# Patient Record
Sex: Male | Born: 2000 | Race: White | Hispanic: No | Marital: Single | Smoking: Never smoker
Health system: Southern US, Community
[De-identification: ages and names within clinical notes are randomized; demographics above are authoritative.]

## PROBLEM LIST (undated history)

## (undated) DIAGNOSIS — F909 Attention-deficit hyperactivity disorder, unspecified type: Secondary | ICD-10-CM

## (undated) HISTORY — PX: MYRINGOTOMY: SUR874

---
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
## 2022-05-26 ENCOUNTER — Ambulatory Visit (INDEPENDENT_AMBULATORY_CARE_PROVIDER_SITE_OTHER): Payer: BC Managed Care – PPO

## 2022-05-26 ENCOUNTER — Ambulatory Visit
Admission: RE | Admit: 2022-05-26 | Discharge: 2022-05-26 | Disposition: A | Discharge status: Home / Self Care | Payer: BC Managed Care – PPO | Source: Ambulatory Visit | Attending: Family Medicine | Admitting: Family Medicine

## 2022-05-26 VITALS — BP 123/87 | HR 92 | Temp 98.8°F | Resp 18 | Ht 75.0 in | Wt 330.0 lb

## 2022-05-26 DIAGNOSIS — R059 Cough, unspecified: Secondary | ICD-10-CM

## 2022-05-26 MED ORDER — BENZONATATE 200 MG PO CAPS: 200.0000 mg | ORAL_CAPSULE | Freq: Three times a day (TID) | ORAL | 0 refills | Status: AC | PRN

## 2022-05-26 MED ORDER — DOXYCYCLINE HYCLATE 100 MG PO CAPS: 100.0000 mg | ORAL_CAPSULE | Freq: Two times a day (BID) | ORAL | 0 refills | Status: AC

## 2022-05-26 MED ORDER — PREDNISONE 20 MG PO TABS: ORAL_TABLET | ORAL | 0 refills | Status: DC

## 2022-05-26 NOTE — Discharge Instructions (Addendum)
Advised of chest x-ray results with hardcopy provided to patient.  Instructed patient to take medication as directed with food to completion.  Advised patient to take prednisone with first dose of doxycycline for the next 5 of 7 days.  Advised may use Tessalon daily or as needed for cough.  Encourage patient to increase daily water intake to 64 ounces per day while taking these medications.  Advised if symptoms worsen and/or unresolved please follow-up with PCP or here for further evaluation.

## 2022-05-26 NOTE — ED Provider Notes (Signed)
[facility]    CSN: [id number] Arrival date & time: 05/26/22  1351      History   Chief Complaint Chief Complaint  Patient presents with   Cough    headache, sore throat, cough, congestioncovid and flu tests are negative but was treated for strep a couple of weeks ago - Entered by patient    HPI Allen Barrett is a 21 y.o. male.   HPI 21 year old male presents with headache, sore throat, congestion, and cough for 2 days. PMH significant for morbid obesity and ADHD.  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 Authorizing Provider  benzonatate (TESSALON) 200 MG capsule Take 1 capsule (200 mg total) by mouth 3 (three) times daily as needed for up to 7 days. 05/26/22 06/02/22 Yes Eliezer Lofts, FNP  doxycycline (VIBRAMYCIN) 100 MG capsule Take 1 capsule (100 mg total) by mouth 2 (two) times daily for 7 days. 05/26/22 06/02/22 Yes Eliezer Lofts, FNP  predniSONE (DELTASONE) 20 MG tablet Take 3 tabs PO daily x 5 days. 05/26/22  Yes Eliezer Lofts, FNP  cetirizine (ZYRTEC) 10 MG tablet Take 1 tablet (10 mg total) by mouth daily. 05/02/20   Scot Jun, FNP  ibuprofen (ADVIL) 600 MG tablet Take 1 tablet (600 mg total) by mouth every 6 (six) hours as needed. 05/02/20   Scot Jun, FNP  triamcinolone (KENALOG) 0.1 % Apply 1 application topically 2 (two) times daily as needed. 05/02/20   Scot Jun, FNP    Family History Family History  Problem Relation Age of Onset   Multiple sclerosis Mother    Hyperlipidemia Father    Hypertension Father     Social History Social History   Tobacco Use    Smoking status: Never   Smokeless tobacco: Never  Vaping Use   Vaping Use: Never used  Substance Use Topics   Alcohol use: No   Drug use: No     Allergies   Patient has no known allergies.   Review of Systems Review of Systems  HENT:  Positive for congestion and sore throat.   All other systems reviewed and are negative.    Physical Exam Triage Vital Signs ED Triage Vitals  Enc Vitals Group     BP      Pulse      Resp      Temp      Temp src      SpO2      Weight      Height      Head Circumference      Peak Flow      Pain Score      Pain Loc      Pain Edu?      Excl. in [place]?    No data found.  Updated Vital Signs BP 123/87 (BP Location: Right Arm)   Pulse 92   Temp 98.8 F (37.1 C) (Oral)   Resp 18   Ht 6\' 3"  (1.905 m)   Wt (!) 330 lb (149.7 kg)   SpO2 94%   BMI 41.25 kg/m  Physical Exam Vitals and nursing note reviewed.  Constitutional:      General: He is not in acute distress.    Appearance: Normal appearance. He is obese. He is not ill-appearing.  HENT:     Head: Normocephalic and atraumatic.     Right Ear: Tympanic membrane, ear canal and external ear normal.     Left Ear: Tympanic membrane, ear canal and external ear normal.     Mouth/Throat:     Mouth: Mucous membranes are moist.     Pharynx: Oropharynx is clear.  Eyes:     Extraocular Movements: Extraocular movements intact.     Conjunctiva/sclera: Conjunctivae normal.     Pupils: Pupils are equal, round, and reactive to light.  Cardiovascular:     Rate and Rhythm: Normal rate and regular rhythm.     Pulses: Normal pulses.     Heart sounds: Normal heart sounds.  Pulmonary:     Effort: Pulmonary effort is normal.     Breath sounds: No wheezing, rhonchi or rales.     Comments: Diminished breath sounds noted throughout Musculoskeletal:        General: Normal range of motion.     Cervical back: Normal range of motion and neck supple. No tenderness.  Lymphadenopathy:      Cervical: No cervical adenopathy.  Skin:    General: Skin is warm and dry.  Neurological:     General: No focal deficit present.     Mental Status: He is alert and oriented to person, place, and time.      UC Treatments / Results  Labs (all labs ordered are listed, but only abnormal results are displayed) Labs Reviewed - No data to display  EKG   Radiology DG Chest 2 View  Result Date: 05/26/2022 CLINICAL DATA:  Cough. EXAM: CHEST - 2 VIEW COMPARISON:  December 17, 2015. FINDINGS: The heart size and mediastinal contours are within normal limits. Both lungs are clear. The visualized skeletal structures are unremarkable. IMPRESSION: No active cardiopulmonary disease. Electronically Signed   By: Lupita Raider M.D.   On: 05/26/2022 16:09    Procedures Procedures (including critical care time)  Medications Ordered in UC Medications - No data to display  Initial Impression / Assessment and Plan / UC Course  I have reviewed the triage vital signs and the nursing notes.  Pertinent labs & imaging results that were available during my care of the patient were reviewed by me and considered in my medical decision making (see chart for details).     MDM: 1.  Cough-CXR revealed above, Rx'd doxycycline, prednisone, Tessalon. Advised of chest x-ray results with hardcopy provided to patient.  Instructed patient to take medication as directed with food to completion.  Advised patient to take prednisone with first dose of doxycycline for the next 5 of 7 days.  Advised may use Tessalon daily or as needed for cough.  Encourage patient to increase daily water intake to 64 ounces per day while taking these medications.  Advised if symptoms worsen and/or unresolved please follow-up with PCP or here for further evaluation.  Work note provided to patient prior to discharge per request.  Patient discharged home, hemodynamically stable. Final Clinical Impressions(s) / UC Diagnoses   Final diagnoses:   Cough, unspecified type     Discharge Instructions      Advised of chest x-ray results with hardcopy provided to patient.  Instructed patient to take medication as directed with food to completion.  Advised patient to  take prednisone with first dose of doxycycline for the next 5 of 7 days.  Advised may use Tessalon daily or as needed for cough.  Encourage patient to increase daily water intake to 64 ounces per day while taking these medications.  Advised if symptoms worsen and/or unresolved please follow-up with PCP or here for further evaluation.     ED Prescriptions     Medication Sig Dispense Auth. Provider   doxycycline (VIBRAMYCIN) 100 MG capsule Take 1 capsule (100 mg total) by mouth 2 (two) times daily for 7 days. 14 capsule Eliezer Lofts, FNP   predniSONE (DELTASONE) 20 MG tablet Take 3 tabs PO daily x 5 days. 15 tablet Eliezer Lofts, FNP   benzonatate (TESSALON) 200 MG capsule Take 1 capsule (200 mg total) by mouth 3 (three) times daily as needed for up to 7 days. 40 capsule Eliezer Lofts, FNP      PDMP not reviewed this encounter.   Eliezer Lofts, [place] 05/26/22 [phone]

## 2022-05-26 NOTE — ED Triage Notes (Signed)
Patient c/o cough and congestion x 2 days.  Patient was seen on Monday had FLU, COVID and STREP test done, all negative.  Patient feels worse and requesting 2nd opinion.  Patient has been taken Mucinex.

## 2022-07-27 ENCOUNTER — Ambulatory Visit: Payer: Managed Care, Other (non HMO) | Admitting: Internal Medicine

## 2022-08-10 ENCOUNTER — Ambulatory Visit: Payer: Managed Care, Other (non HMO) | Admitting: Internal Medicine

## 2022-08-24 ENCOUNTER — Ambulatory Visit (INDEPENDENT_AMBULATORY_CARE_PROVIDER_SITE_OTHER): Payer: BC Managed Care – PPO | Admitting: Internal Medicine

## 2022-08-24 ENCOUNTER — Encounter: Payer: Self-pay | Admitting: Internal Medicine

## 2022-08-24 ENCOUNTER — Telehealth: Payer: Self-pay

## 2022-08-24 VITALS — BP 124/68 | HR 88 | Temp 98.1°F | Resp 20 | Ht 75.0 in | Wt 348.0 lb

## 2022-08-24 DIAGNOSIS — L501 Idiopathic urticaria: Secondary | ICD-10-CM

## 2022-08-24 NOTE — Telephone Encounter (Signed)
I called Windell Hummingbird PA at [facility] wait time to speak to a representative is [phone] (fourteen ) minutes I entered our clinic number for a return call. We need a copy of patients last allergy test results. Patient referred to [place] and states he was tested a few weeks ago.

## 2022-08-24 NOTE — Progress Notes (Signed)
New Patient Note  RE: Allen Barrett MRN: [id number] DOB: 2001/03/11 Date of Office Visit: 08/24/2022  Consult requested by: Windell Hummingbird, PA-C Primary care provider: Pcp, No  Chief Complaint: Urticaria (Started 53-20 years old )  History of Present Illness: I had the pleasure of seeing Allen Barrett for initial evaluation at the [facility] on 08/24/2022. Allen Barrett is a 22 y.o. male, who is referred here by Pcp, No for the evaluation of urticaria.  History obtained from patient, chart review .  Urticaria:  Symptoms started  2 years ago.  Initially symptoms occurred every other day.  No clear triggers.  Would resolve after 30 minutes.  Initially treated with topical creams without good response.   Slowly decreased in frequency and now occurring twice a week.    Allen Barrett was seen by [facility] and they did performed SPT and sIgE to food and environmentals.  Records pending.    Due to testing Allen Barrett removed cabbage, carrotts, sesame seeds from diet.  Did not fully remove tree nuts.  This did not fully alleviate hives.   Denies any associated food, medication or contact exposure.  Denies any association with sun, cold, heat, exercise, pressure, water or vibration exposure.  Lesions are not painful or fixed.  Denies any associated fevers or arthritis.  Pictures consistent with urticaria   Assessment and Plan: Allen Barrett is a 22 y.o. male with: Idiopathic urticaria - Plan: CBC With Diff/Platelet, CMP14+EGFR, Tryptase, TSH, Thyroid antibodies, Chronic Urticaria, C-reactive protein, Alpha-Gal Panel   Plan: Patient Instructions  Chronic Idiopathic Urticaria: - this is defined as hives lasting more than 6 weeks without an identifiable trigger - hives can be from a number of different sources including infections, allergies, vibration, temperature, pressure among many others other possible causes - often an identifiable cause is not determined - some potential triggers include: stress,  illness, NSAIDs, aspirin, hormonal changes - you do not have any red flag symptoms to make [place] concerned about secondary causes of hives, but we will screen for these for reassurance with: CBC w diff, CMP, tryptase, TSH, hive panel, alpha-gal panel, inflammatory markers - approximately 50% of patients with chronic hives can have some associated swelling of the face/lips/eyelids (this is not a cause for alarm and does not typically progress onto systemic allergic reactions)  Therapy Plan:  - start zyrtec (cetirizine) 10mg  once twice daily  - if hives are uncontrolled, increase zyrtec (cetirizine) to 20mg  twice daily -this is maximum dose - can increase or decrease dosing depending on symptom control to a maximum dose of 4 tablets of antihistamine daily. Wait until hives free for at least one month prior to decreasing dose.   - if hives are still uncontrolled with the above regimen, please arrange an appointment for discussion of Xolair (omalizumab)- an injectable medication for hives  Can use one of the following in place of zyrtec if desires: Claritin (loratadine) 10 mg, Xyzal (levocetirizine) 5 mg or Allegra (fexofenadine) 180 mg daily as needed  Can purchase generic antihistamines much more cheaply on [place] or at places like [employer] or [place]  -Based on history I believe your previous positive food testing is due to a sensitization and not a true food allergy -Once hives are under control for at least 7 days introduce foods you are avoiding 1 at a time at home, separate food introduction by 72 hours  Follow up: 4 weeks   Thank you so much for letting  me partake in your care today.  Don't hesitate to reach out if you have any additional concerns!  Roney Marion, MD  [facility] [place] [place]      No orders of the defined types were placed in this encounter.  Lab Orders         CBC With Diff/Platelet         CMP14+EGFR         Tryptase          TSH         Thyroid antibodies         Chronic Urticaria         C-reactive protein         Alpha-Gal Panel      Other allergy screening: Asthma: no Rhino conjunctivitis: no Food allergy:  as above  Medication allergy: no Hymenoptera allergy: no Urticaria: yes Eczema:no History of recurrent infections suggestive of immunodeficency: no  Diagnostics: Skin Testing:  deferred due to recent testing  .  Results interpreted by myself and discussed with patient/family.   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 30 tablet 0   penicillin v potassium (VEETID) 500 MG tablet Take by mouth.     No current facility-administered medications for this visit.   Allergies: No Known Allergies Social History: Social History   Socioeconomic History   Marital status: Single    Spouse name: Not on file   Number of children: Not on file   Years of education: Not on file   Highest education level: Not on file  Occupational History   Not on file  Tobacco Use   Smoking status: Never    Passive exposure: Past (mother has in the past)   Smokeless tobacco: Never  Vaping Use   Vaping Use: Never used  Substance and Sexual Activity   Alcohol use: No   Drug use: No   Sexual activity: Not on file  Other Topics Concern   Not on file  Social History Narrative   Not on file   Social Determinants of Health   Financial Resource  Strain: Not on file  Food Insecurity: Not on file  Transportation Needs: Not on file  Physical Activity: Not on file  Stress: Not on file  Social Connections: Not on file   Lives in a single-family home, no roaches in the house and bed is 2 feet off the floor.  Dust mite precautions on bed and pillows.  Not exposed to fumes, chemicals or dust.  HEPA filter in the home and home is not near an interstate industrial area. Smoking: No exposure Occupation: Works as a Electronics engineer History: Environmental education officer in the house: no Charity fundraiser in the family room: no Carpet in the bedroom: yes Heating: electric Cooling: central Pet: no  Family History: Family History  Problem Relation Age of Onset   Multiple sclerosis Mother  Hyperlipidemia Father    Hypertension Father    Asthma Neg Hx    Allergic rhinitis Neg Hx    Atopy Neg Hx    Eczema Neg Hx    Immunodeficiency Neg Hx    Angioedema Neg Hx      ROS: All others negative except as noted per HPI.   Objective: BP 124/68   Pulse 88   Temp 98.1 F (36.7 C) (Temporal)   Resp 20   Ht 6\' 3"  (1.905 m)   Wt (!) 348 lb (157.9 kg)   SpO2 97%   BMI 43.50 kg/m  Body mass index is 43.5 kg/m.  General Appearance:  Alert, cooperative, no distress, appears stated age  Head:  Normocephalic, without obvious abnormality, atraumatic  Eyes:  Conjunctiva clear, EOM's intact  Nose: Nares normal, normal mucosa, no visible anterior polyps, and septum midline  Throat: Lips, tongue normal; teeth and gums normal, normal posterior oropharynx and no tonsillar exudate  Neck: Supple, symmetrical  Lungs:   clear to auscultation bilaterally, Respirations unlabored, no coughing  Heart:  regular rate and rhythm and no murmur, Appears well perfused  Extremities: No edema  Skin: Skin color, texture, turgor normal, no rashes or lesions on visualized portions of skin  Neurologic: No gross deficits   The plan was reviewed with the  patient/family, and all questions/concerned were addressed.  It was my pleasure to see Allen Barrett today and participate in his care. Please feel free to contact me with any questions or concerns.  Sincerely,  Roney Marion, MD Allergy & Immunology  [facility] office: [phone] [facility] office: [phone]

## 2022-08-24 NOTE — Patient Instructions (Addendum)
Chronic Idiopathic Urticaria: - this is defined as hives lasting more than 6 weeks without an identifiable trigger - hives can be from a number of different sources including infections, allergies, vibration, temperature, pressure among many others other possible causes - often an identifiable cause is not determined - some potential triggers include: stress, illness, NSAIDs, aspirin, hormonal changes - you do not have any red flag symptoms to make [place] concerned about secondary causes of hives, but we will screen for these for reassurance with: CBC w diff, CMP, tryptase, TSH, hive panel, alpha-gal panel, inflammatory markers - approximately 50% of patients with chronic hives can have some associated swelling of the face/lips/eyelids (this is not a cause for alarm and does not typically progress onto systemic allergic reactions)  Therapy Plan:  - start zyrtec (cetirizine) 10mg  once twice daily  - if hives are uncontrolled, increase zyrtec (cetirizine) to 20mg  twice daily -this is maximum dose - can increase or decrease dosing depending on symptom control to a maximum dose of 4 tablets of antihistamine daily. Wait until hives free for at least one month prior to decreasing dose.   - if hives are still uncontrolled with the above regimen, please arrange an appointment for discussion of Xolair (omalizumab)- an injectable medication for hives  Can use one of the following in place of zyrtec if desires: Claritin (loratadine) 10 mg, Xyzal (levocetirizine) 5 mg or Allegra (fexofenadine) 180 mg daily as needed  Can purchase generic antihistamines much more cheaply on [place] or at places like [employer] or [place]  -Based on history I believe your previous positive food testing is due to a sensitization and not a true food allergy -Once hives are under control for at least 7 days introduce foods you are avoiding 1 at a time at home, separate food introduction by 72 hours  Follow  up: 4 weeks   Thank you so much for letting me partake in your care today.  Don't hesitate to reach out if you have any additional concerns!  Roney Marion, MD  [facility] [place] [place]

## 2022-08-25 LAB — CMP14+EGFR
Albumin: 4.8 g/dL (ref 4.3–5.2)
BUN: 11 mg/dL (ref 6–20)
Potassium: 4.6 mmol/L (ref 3.5–5.2)

## 2022-08-25 LAB — TSH: TSH: 3.3 u[IU]/mL (ref 0.450–4.500)

## 2022-08-25 LAB — TRYPTASE

## 2022-08-25 LAB — C-REACTIVE PROTEIN: CRP: 3 mg/L (ref 0–10)

## 2022-08-25 LAB — CBC WITH DIFF/PLATELET
Basophils Absolute: 0.1 10*3/uL (ref 0.0–0.2)
Eos: 1 %
RDW: 12.7 % (ref 11.6–15.4)

## 2022-08-25 LAB — CHRONIC URTICARIA

## 2022-08-25 NOTE — Telephone Encounter (Signed)
Thanks

## 2022-08-26 LAB — ALPHA-GAL PANEL

## 2022-08-26 LAB — CBC WITH DIFF/PLATELET
EOS (ABSOLUTE): 0.1 10*3/uL (ref 0.0–0.4)
Hematocrit: 45.7 % (ref 37.5–51.0)
Immature Grans (Abs): 0 10*3/uL (ref 0.0–0.1)
Lymphocytes Absolute: 4.5 10*3/uL — ABNORMAL HIGH (ref 0.7–3.1)
MCV: 85 fL (ref 79–97)
Monocytes Absolute: 0.8 10*3/uL (ref 0.1–0.9)
Monocytes: 7 %
Neutrophils Absolute: 5.6 10*3/uL (ref 1.4–7.0)
Neutrophils: 50 %
Platelets: 357 10*3/uL (ref 150–450)
WBC: 11.1 10*3/uL — ABNORMAL HIGH (ref 3.4–10.8)

## 2022-08-26 LAB — CMP14+EGFR
Alkaline Phosphatase: 115 IU/L (ref 51–125)
CO2: 23 mmol/L (ref 20–29)
Calcium: 10.5 mg/dL — ABNORMAL HIGH (ref 8.7–10.2)
Globulin, Total: 3 g/dL (ref 1.5–4.5)
Glucose: 82 mg/dL (ref 70–99)
Sodium: 139 mmol/L (ref 134–144)
eGFR: 113 mL/min/{1.73_m2} (ref >59)

## 2022-08-26 LAB — THYROID ANTIBODIES: Thyroglobulin Antibody: <1 IU/mL (ref 0.0–0.9)

## 2022-08-29 LAB — CMP14+EGFR: AST: 44 IU/L — ABNORMAL HIGH (ref 0–40)

## 2022-08-29 LAB — CBC WITH DIFF/PLATELET
Hemoglobin: 15.1 g/dL (ref 13.0–17.7)
MCH: 28 pg (ref 26.6–33.0)
RBC: 5.39 x10E6/uL (ref 4.14–5.80)

## 2022-08-29 LAB — ALPHA-GAL PANEL
Allergen Lamb IgE: <0.1 kU/L
O215-IgE Alpha-Gal: <0.1 kU/L

## 2022-08-29 LAB — THYROID ANTIBODIES: Thyroperoxidase Ab SerPl-aCnc: <9 IU/mL (ref 0–26)

## 2022-08-30 NOTE — Telephone Encounter (Signed)
Spoke to [place], she tried to track down the MA for provider to pull chart and fax records of skin testing and any labs, but phone only rang in clinic. Allen Barrett  took down message with my name , phone number and fax. Hopefully records can be faxed today, in a day or two.

## 2022-08-31 LAB — CBC WITH DIFF/PLATELET
Basos: 1 %
Immature Granulocytes: 0 %
Lymphs: 41 %
MCHC: 33 g/dL (ref 31.5–35.7)

## 2022-08-31 LAB — CMP14+EGFR
ALT: 92 IU/L — ABNORMAL HIGH (ref 0–44)
Albumin/Globulin Ratio: 1.6 (ref 1.2–2.2)
BUN/Creatinine Ratio: 11 (ref 9–20)
Bilirubin Total: 0.4 mg/dL (ref 0.0–1.2)
Chloride: 99 mmol/L (ref 96–106)
Creatinine, Ser: 0.99 mg/dL (ref 0.76–1.27)
Total Protein: 7.8 g/dL (ref 6.0–8.5)

## 2022-09-07 NOTE — Progress Notes (Signed)
Blood work showed elevation of some of his liver enzymes.  I recommend following up with primary care.  Otherwise the rest of his labs (thyroid function, mast cell evaluation, kidney function, blood counts) were normal.  Can someone let patient know?

## 2022-09-09 NOTE — Telephone Encounter (Signed)
See message on labs.  

## 2022-09-22 ENCOUNTER — Ambulatory Visit: Payer: Managed Care, Other (non HMO) | Admitting: Internal Medicine

## 2022-09-22 DIAGNOSIS — J309 Allergic rhinitis, unspecified: Secondary | ICD-10-CM

## 2022-10-13 ENCOUNTER — Ambulatory Visit
Admission: RE | Admit: 2022-10-13 | Discharge: 2022-10-13 | Disposition: A | Discharge status: Home / Self Care | Payer: BC Managed Care – PPO | Source: Ambulatory Visit | Attending: Family Medicine | Admitting: Family Medicine

## 2022-10-13 VITALS — BP 128/89 | HR 81 | Temp 98.5°F | Resp 16 | Ht 75.5 in | Wt 340.0 lb

## 2022-10-13 DIAGNOSIS — R059 Cough, unspecified: Secondary | ICD-10-CM | POA: Diagnosis not present

## 2022-10-13 MED ORDER — PREDNISONE 20 MG PO TABS: ORAL_TABLET | ORAL | 0 refills | Status: DC

## 2022-10-13 MED ORDER — AZITHROMYCIN 250 MG PO TABS: 250.0000 mg | ORAL_TABLET | Freq: Every day | ORAL | 0 refills | Status: DC

## 2022-10-13 NOTE — ED Provider Notes (Signed)
[facility]    CSN: [id number] Arrival date & time: 10/13/22  1301      History   Chief Complaint Chief Complaint  Patient presents with   Cough    Sour throat, productive cough - Entered by patient    HPI Allen Barrett is a 22 y.o. male.   HPI 22 year old male presents with cough, (nonproductive), sore throat, and fatigue for 2 days.  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 Authorizing Provider  azithromycin (ZITHROMAX) 250 MG tablet Take 1 tablet (250 mg total) by mouth daily. Take first 2 tablets together, then 1 every day until finished. 10/13/22  Yes Trevor Iha, FNP  EPINEPHrine 0.3 mg/0.3 mL IJ SOAJ injection SMARTSIG:Milliliter(s) IM 07/07/22  Yes [provider]  ibuprofen (ADVIL) 600 MG tablet Take 1 tablet (600 mg total) by mouth every 6 (six) hours as needed. 05/02/20  Yes Bing Neighbors, NP  predniSONE (DELTASONE) 20 MG tablet Take 3 tabs PO daily x 5 days. 10/13/22  Yes Trevor Iha, FNP    Family History Family History  Problem Relation Age of Onset   Multiple sclerosis Mother    Hyperlipidemia Father    Hypertension Father    Asthma Neg Hx    Allergic rhinitis Neg Hx    Atopy Neg Hx    Eczema Neg Hx    Immunodeficiency Neg Hx    Angioedema Neg Hx     Social History Social History   Tobacco Use   Smoking status: Never    Passive exposure: Past (mother has in the past)   Smokeless tobacco: Never  Vaping Use   Vaping Use: Never used  Substance Use Topics   Alcohol use: No   Drug use: No     Allergies   Patient has no known allergies.   Review of  Systems Review of Systems  HENT:  Positive for congestion.   Respiratory:  Positive for cough.   All other systems reviewed and are negative.    Physical Exam Triage Vital Signs ED Triage Vitals  Enc Vitals Group     BP      Pulse      Resp      Temp      Temp src      SpO2      Weight      Height      Head Circumference      Peak Flow      Pain Score      Pain Loc      Pain Edu?      Excl. in GC?    No data found.  Updated Vital Signs BP 128/89 (BP Location: Right Arm)   Pulse 81   Temp 98.5 F (36.9 C) (Oral)   Resp 16   Ht 6' 3.5" (1.918 m)   Wt (!) 340 lb (154.2 kg)   SpO2 96%   BMI 41.94 kg/m      Physical Exam Vitals and nursing note reviewed.  Constitutional:      Appearance: Normal appearance. He is obese.  He is ill-appearing.  HENT:     Head: Normocephalic and atraumatic.     Right Ear: Tympanic membrane, ear canal and external ear normal.     Left Ear: Tympanic membrane, ear canal and external ear normal.     Mouth/Throat:     Mouth: Mucous membranes are moist.     Pharynx: Oropharynx is clear.  Eyes:     Extraocular Movements: Extraocular movements intact.     Conjunctiva/sclera: Conjunctivae normal.     Pupils: Pupils are equal, round, and reactive to light.  Cardiovascular:     Rate and Rhythm: Normal rate and regular rhythm.     Pulses: Normal pulses.     Heart sounds: Normal heart sounds.  Pulmonary:     Effort: Pulmonary effort is normal.     Breath sounds: Normal breath sounds.     Comments: Infrequent nonproductive cough noted on exam Musculoskeletal:        General: Normal range of motion.     Cervical back: Normal range of motion and neck supple.  Skin:    General: Skin is warm and dry.  Neurological:     General: No focal deficit present.     Mental Status: He is alert and oriented to person, place, and time. Mental status is at baseline.      UC Treatments / Results  Labs (all labs ordered are listed, but only  abnormal results are displayed) Labs Reviewed - No data to display  EKG   Radiology No results found.  Procedures Procedures (including critical care time)  Medications Ordered in UC Medications - No data to display  Initial Impression / Assessment and Plan / UC Course  I have reviewed the triage vital signs and the nursing notes.  Pertinent labs & imaging results that were available during my care of the patient were reviewed by me and considered in my medical decision making (see chart for details).     MDM: 1. Cough-Rx'd Zithromax (500 mg day 1, then 250 mg daily x 4 days) prednisone 60 mg daily x 5 days. Advised patient to take medication as directed with food to completion.  Advised patient to take prednisone with Zithromax daily for the next 5 days.  Encouraged increase daily water intake to 64 ounces per day while taking these medications.  Advised if symptoms worsen and/or unresolved please follow-up with PCP or here for further evaluation.  Patient discharged home, hemodynamically stable.  Work note provided to patient prior to discharge. Final Clinical Impressions(s) / UC Diagnoses   Final diagnoses:  Cough, unspecified type     Discharge Instructions      Advised patient to take medication as directed with food to completion.  Advised patient to take prednisone with Zithromax daily for the next 5 days.  Encouraged increase daily water intake to 64 ounces per day while taking these medications.  Advised if symptoms worsen and/or unresolved please follow-up with PCP or here for further evaluation.     ED Prescriptions     Medication Sig Dispense Auth. Provider   azithromycin (ZITHROMAX) 250 MG tablet Take 1 tablet (250 mg total) by mouth daily. Take first 2 tablets together, then 1 every day until finished. 6 tablet Trevor Iha, FNP   predniSONE (DELTASONE) 20 MG tablet Take 3 tabs PO daily x 5 days. 15 tablet Trevor Iha, FNP      PDMP not reviewed this  encounter.   Trevor Iha, FNP 10/13/22 1408

## 2022-10-13 NOTE — Discharge Instructions (Addendum)
Advised patient to take medication as directed with food to completion.  Advised patient to take prednisone with Zithromax daily for the next 5 days.  Encouraged increase daily water intake to 64 ounces per day while taking these medications.  Advised if symptoms worsen and/or unresolved please follow-up with PCP or here for further evaluation.

## 2022-10-13 NOTE — ED Triage Notes (Signed)
Patient c/o non-productive cough, sore throat and fatigue x 2 days.  Patient has taken Dayquil and Nyquil.

## 2023-04-19 ENCOUNTER — Ambulatory Visit
Admission: RE | Admit: 2023-04-19 | Discharge: 2023-04-19 | Disposition: A | Discharge status: Home / Self Care | Payer: BC Managed Care – PPO | Source: Ambulatory Visit | Attending: Family Medicine | Admitting: Family Medicine

## 2023-04-19 ENCOUNTER — Ambulatory Visit: Payer: Self-pay

## 2023-04-19 VITALS — BP 137/95 | HR 97 | Temp 98.1°F | Resp 17

## 2023-04-19 DIAGNOSIS — J3489 Other specified disorders of nose and nasal sinuses: Secondary | ICD-10-CM

## 2023-04-19 DIAGNOSIS — J0100 Acute maxillary sinusitis, unspecified: Secondary | ICD-10-CM | POA: Diagnosis not present

## 2023-04-19 DIAGNOSIS — H6123 Impacted cerumen, bilateral: Secondary | ICD-10-CM | POA: Diagnosis not present

## 2023-04-19 MED ORDER — AMOXICILLIN-POT CLAVULANATE 875-125 MG PO TABS: 1.0000 | ORAL_TABLET | Freq: Two times a day (BID) | ORAL | 0 refills | Status: AC

## 2023-04-19 MED ORDER — PREDNISONE 20 MG PO TABS: ORAL_TABLET | ORAL | 0 refills | Status: DC

## 2023-04-19 NOTE — ED Triage Notes (Signed)
Pt c/o cough, congestion and bilateral ear pressure since Sat. No Known Fever. Taking dayquil and nyquil prn.

## 2023-04-19 NOTE — Discharge Instructions (Addendum)
Advised patient to take medication as directed with food to completion.  Advised patient to take prednisone with first dose of Augmentin for the next 5 of 10 days.  Encouraged to increase daily water intake to 64 ounces per day while taking these medications.  Advised if symptoms worsen and/or unresolved please follow-up with PCP or here for further evaluation.

## 2023-04-19 NOTE — ED Provider Notes (Signed)
[facility]    CSN: [id number] Arrival date & time: 04/19/23  1251      History   Chief Complaint Chief Complaint  Patient presents with   Cough   Otalgia    Pressure/bilateral   Nasal Congestion    HPI Allen Barrett is a 22 y.o. male.   HPI Pleasant 22 year old male presents with cough, congestion and bilateral ear pressure for 5 days.  PMH significant for morbid obesity and ADHD.  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 Authorizing Provider  amoxicillin-clavulanate (AUGMENTIN) 875-125 MG tablet Take 1 tablet by mouth 2 (two) times daily for 10 days. 04/19/23 04/29/23 Yes Trevor Iha, FNP  predniSONE (DELTASONE) 20 MG tablet Take 3 tabs PO daily x 5 days. 04/19/23  Yes Trevor Iha, FNP  EPINEPHrine 0.3 mg/0.3 mL IJ SOAJ injection SMARTSIG:Milliliter(s) IM 07/07/22   [provider]  ibuprofen (ADVIL) 600 MG tablet Take 1 tablet (600 mg total) by mouth every 6 (six) hours as needed. 05/02/20   Bing Neighbors, NP    Family History Family History  Problem Relation Age of Onset   Multiple sclerosis Mother    Hyperlipidemia Father    Hypertension Father    Asthma Neg Hx    Allergic rhinitis Neg Hx    Atopy Neg Hx    Eczema Neg Hx    Immunodeficiency Neg Hx    Angioedema Neg Hx     Social History Social History   Tobacco Use   Smoking status: Never    Passive exposure: Past (mother has in the past)   Smokeless tobacco: Never  Vaping Use   Vaping status: Never Used  Substance Use Topics   Alcohol use: No   Drug use: No     Allergies   Patient has no known  allergies.   Review of Systems Review of Systems   Physical Exam Triage Vital Signs ED Triage Vitals [04/19/23 1305]  Encounter Vitals Group     BP (!) 137/95     Systolic BP Percentile      Diastolic BP Percentile      Pulse Rate 97     Resp 17     Temp 98.1 F (36.7 C)     Temp Source Oral     SpO2 97 %     Weight      Height      Head Circumference      Peak Flow      Pain Score 0     Pain Loc      Pain Education      Exclude from Growth Chart    No data found.  Updated Vital Signs BP (!) 137/95 (BP Location: Left Arm)   Pulse 97   Temp 98.1 F (36.7 C) (Oral)   Resp 17   SpO2 97%   Visual Acuity Right Eye Distance:   Left Eye Distance:   Bilateral Distance:    Right Eye Near:   Left Eye Near:    Bilateral Near:     Physical Exam Vitals and nursing  note reviewed.  Constitutional:      Appearance: Normal appearance. He is normal weight.  HENT:     Head: Normocephalic and atraumatic.     Right Ear: External ear normal.     Left Ear: External ear normal.     Ears:     Comments: Excessive cerumen noted in bilateral EACs.  Post bilateral ear lavage: Right EAC-clear, Right TM-erythematous, bulging; Left EAC-clear, Left TM-clear, retracted with good light reflex and mobility    Mouth/Throat:     Mouth: Mucous membranes are moist.     Pharynx: Oropharynx is clear.  Eyes:     Extraocular Movements: Extraocular movements intact.     Conjunctiva/sclera: Conjunctivae normal.     Pupils: Pupils are equal, round, and reactive to light.  Cardiovascular:     Rate and Rhythm: Normal rate and regular rhythm.     Pulses: Normal pulses.     Heart sounds: Normal heart sounds.  Pulmonary:     Effort: Pulmonary effort is normal.     Breath sounds: Normal breath sounds. No wheezing, rhonchi or rales.  Musculoskeletal:        General: Normal range of motion.     Cervical back: Normal range of motion and neck supple.  Skin:    General: Skin is warm and dry.   Neurological:     General: No focal deficit present.     Mental Status: He is alert and oriented to person, place, and time. Mental status is at baseline.      UC Treatments / Results  Labs (all labs ordered are listed, but only abnormal results are displayed) Labs Reviewed - No data to display  EKG   Radiology No results found.  Procedures Procedures (including critical care time)  Medications Ordered in UC Medications - No data to display  Initial Impression / Assessment and Plan / UC Course  I have reviewed the triage vital signs and the nursing notes.  Pertinent labs & imaging results that were available during my care of the patient were reviewed by me and considered in my medical decision making (see chart for details).     MDM: 1.  Acute maxillary sinusitis, recurrence not specified-Rx'd Augmentin 875/125 mg tablet: Take 1 tablet twice daily x 10 days; 2.  Sinus pressure-Rx'd prednisone 20 mg tablet: Take 3 tabs p.o. daily x 5 days; 3.  Excessive cerumen in both ear canals-resolved with bilateral ear lavage. Advised patient to take medication as directed with food to completion.  Advised patient to take prednisone with first dose of Augmentin for the next 5 of 10 days.  Encouraged to increase daily water intake to 64 ounces per day while taking these medications.  Advised if symptoms worsen and/or unresolved please follow-up with PCP or here for further evaluation.  Patient discharged home, hemodynamically stable. Final Clinical Impressions(s) / UC Diagnoses   Final diagnoses:  Acute maxillary sinusitis, recurrence not specified  Sinus pressure  Excessive cerumen in both ear canals     Discharge Instructions      Advised patient to take medication as directed with food to completion.  Advised patient to take prednisone with first dose of Augmentin for the next 5 of 10 days.  Encouraged to increase daily water intake to 64 ounces per day while taking these  medications.  Advised if symptoms worsen and/or unresolved please follow-up with PCP or here for further evaluation.     ED Prescriptions     Medication Sig Dispense Auth.  Provider   amoxicillin-clavulanate (AUGMENTIN) 875-125 MG tablet Take 1 tablet by mouth 2 (two) times daily for 10 days. 20 tablet Trevor Iha, FNP   predniSONE (DELTASONE) 20 MG tablet Take 3 tabs PO daily x 5 days. 15 tablet Trevor Iha, FNP      PDMP not reviewed this encounter.   Trevor Iha, FNP 04/19/23 1400

## 2023-06-14 ENCOUNTER — Telehealth: Payer: Self-pay | Admitting: Internal Medicine

## 2023-06-14 NOTE — Telephone Encounter (Signed)
 Pt's dad request a call back from [facility] he states he spoke with her yesterday concerning an issue.

## 2023-06-14 NOTE — Telephone Encounter (Signed)
 Spoke with dad need to get the test codes for the lab test we had ordered in march and will call dad back with those codes

## 2023-06-15 NOTE — Telephone Encounter (Signed)
 I have codes printed out for dad to pick up here in [place] dad understood

## 2023-06-29 ENCOUNTER — Telehealth: Payer: Self-pay

## 2023-06-29 NOTE — Telephone Encounter (Signed)
Dad calling earlier today needing the office visit notes of 08/24/2022 in order for his insurance to cover certain labs that was done that day. Will have Sherri to fax 08/24/2022 office notes to [facility] for them to review before the insurance company makes a decision to pay for the certain labs that were drawn. A signed release form was signed by patient and emailed to [facility] asthma. I printed off the signed release form from my email and put it in for scanning. Dad will let us know if he will need anything else. [facility] fax number [phone] Attn: tiffany;[id number]

## 2023-07-15 ENCOUNTER — Other Ambulatory Visit: Payer: Self-pay

## 2023-07-15 ENCOUNTER — Ambulatory Visit
Admission: RE | Admit: 2023-07-15 | Discharge: 2023-07-15 | Disposition: A | Discharge status: Home / Self Care | Payer: BC Managed Care – PPO | Source: Ambulatory Visit | Attending: Family Medicine | Admitting: Family Medicine

## 2023-07-15 VITALS — BP 138/83 | HR 87 | Temp 98.7°F | Resp 18 | Ht 76.0 in | Wt 300.0 lb

## 2023-07-15 DIAGNOSIS — J039 Acute tonsillitis, unspecified: Secondary | ICD-10-CM | POA: Diagnosis not present

## 2023-07-15 DIAGNOSIS — K122 Cellulitis and abscess of mouth: Secondary | ICD-10-CM

## 2023-07-15 LAB — POCT INFLUENZA A/B
Influenza A, POC: NEGATIVE
Influenza B, POC: NEGATIVE

## 2023-07-15 LAB — POCT RAPID STREP A (OFFICE): Rapid Strep A Screen: NEGATIVE

## 2023-07-15 MED ORDER — PREDNISONE 20 MG PO TABS: ORAL_TABLET | ORAL | 0 refills | Status: DC

## 2023-07-15 MED ORDER — AMOXICILLIN-POT CLAVULANATE 875-125 MG PO TABS: 1.0000 | ORAL_TABLET | Freq: Two times a day (BID) | ORAL | 0 refills | Status: DC

## 2023-07-15 NOTE — Discharge Instructions (Addendum)
 Advised patient to take medication as directed with food to completion.  Advised patient to take prednisone with first dose of Augmentin for the next 5 of 7 days.  Encouraged to increase daily water intake to 64 ounces per day while taking these medications.  Advised if symptoms worsen and/or unresolved please follow-up with PCP or here for further evaluation.

## 2023-07-15 NOTE — ED Triage Notes (Signed)
 Patient c/o nasal drainage, sore throat, cough and congestion x 4 days.  Patient has taken Dayquil and Nyquil w/o any relief.

## 2023-07-15 NOTE — ED Provider Notes (Signed)
 [facility]    CSN: [id number] Arrival date & time: 07/15/23  0901      History   Chief Complaint Chief Complaint  Patient presents with   Chills    Chills, cough, congestion, sore throat and swollen glands - Entered by patient    HPI Allen Barrett is a 23 y.o. male.   HPI 23 year old male presents with nasal drainage, sore throat, cough and congestion for 4 to 5 days.  PMH significant for morbid obesity and ADHD.  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 Authorizing Provider  amoxicillin -clavulanate (AUGMENTIN ) 875-125 MG tablet Take 1 tablet by mouth every 12 (twelve) hours. 07/15/23  Yes Teddy Sharper, FNP  predniSONE  (DELTASONE ) 20 MG tablet Take 3 tabs PO daily x 5 days. 07/15/23  Yes Teddy Sharper, FNP  EPINEPHrine 0.3 mg/0.3 mL IJ SOAJ injection SMARTSIG:Milliliter(s) IM 07/07/22   [provider]  ibuprofen  (ADVIL ) 600 MG tablet Take 1 tablet (600 mg total) by mouth every 6 (six) hours as needed. 05/02/20   Arloa Suzen RAMAN, NP    Family History Family History  Problem Relation Age of Onset   Multiple sclerosis Mother    Hyperlipidemia Father    Hypertension Father    Asthma Neg Hx    Allergic rhinitis Neg Hx    Atopy Neg Hx    Eczema Neg Hx    Immunodeficiency Neg Hx    Angioedema Neg Hx     Social History Social History   Tobacco Use   Smoking status: Never    Passive exposure: Past (mother has in the past)   Smokeless tobacco: Never  Vaping Use   Vaping status: Never Used  Substance Use Topics   Alcohol use: No   Drug use: No     Allergies   Patient has no known  allergies.   Review of Systems Review of Systems  HENT:  Positive for congestion and sore throat.   Respiratory:  Positive for cough.   All other systems reviewed and are negative.    Physical Exam Triage Vital Signs ED Triage Vitals  Encounter Vitals Group     BP 07/15/23 0938 138/83     Systolic BP Percentile --      Diastolic BP Percentile --      Pulse Rate 07/15/23 0938 87     Resp 07/15/23 0938 18     Temp 07/15/23 0938 98.7 F (37.1 C)     Temp Source 07/15/23 0938 Oral     SpO2 07/15/23 0938 95 %     Weight 07/15/23 0940 300 lb (136.1 kg)     Height 07/15/23 0940 6' 4 (1.93 m)     Head Circumference --      Peak Flow --      Pain Score 07/15/23 0940 7     Pain Loc --      Pain Education --      Exclude from Growth Chart --    No data found.  Updated Vital Signs BP 138/83 (BP Location: Right Arm)   Pulse 87  Temp 98.7 F (37.1 C) (Oral)   Resp 18   Ht 6' 4 (1.93 m)   Wt 300 lb (136.1 kg)   SpO2 95%   BMI 36.52 kg/m   Physical Exam Vitals and nursing note reviewed.  Constitutional:      General: He is not in acute distress.    Appearance: Normal appearance. He is obese. He is ill-appearing.  HENT:     Head: Normocephalic and atraumatic.     Right Ear: Tympanic membrane, ear canal and external ear normal.     Left Ear: Tympanic membrane, ear canal and external ear normal.     Mouth/Throat:     Mouth: Mucous membranes are moist.     Pharynx: Oropharynx is clear. Uvula midline. Posterior oropharyngeal erythema and uvula swelling present.     Tonsils: 4+ on the right. 4+ on the left.  Eyes:     Extraocular Movements: Extraocular movements intact.     Conjunctiva/sclera: Conjunctivae normal.     Pupils: Pupils are equal, round, and reactive to light.  Cardiovascular:     Rate and Rhythm: Normal rate and regular rhythm.     Pulses: Normal pulses.     Heart sounds: Normal heart sounds.  Pulmonary:     Effort: Pulmonary effort is normal.      Breath sounds: Normal breath sounds. No wheezing, rhonchi or rales.  Musculoskeletal:        General: Normal range of motion.     Cervical back: Normal range of motion and neck supple.  Skin:    General: Skin is warm and dry.  Neurological:     General: No focal deficit present.     Mental Status: He is alert and oriented to person, place, and time.  Psychiatric:        Mood and Affect: Mood normal.        Behavior: Behavior normal.      UC Treatments / Results  Labs (all labs ordered are listed, but only abnormal results are displayed) Labs Reviewed  POCT INFLUENZA A/B - Normal  POCT RAPID STREP A (OFFICE) - Normal    EKG   Radiology No results found.  Procedures Procedures (including critical care time)  Medications Ordered in UC Medications - No data to display  Initial Impression / Assessment and Plan / UC Course  I have reviewed the triage vital signs and the nursing notes.  Pertinent labs & imaging results that were available during my care of the patient were reviewed by me and considered in my medical decision making (see chart for details).     MDM: 1.  Acute tonsillitis, unspecified etiology-Rx'd Augmentin  875/125 mg tablet: Take 1 tablet twice daily x 7 days, Rx'd prednisone  20 mg tablet: Take 3 tabs p.o. daily x 5 days; 2.  Uvulitis-Rx'd Augmentin  875/125 mg tablet: Take 1 tablet twice daily x 7 days. Advised patient to take medication as directed with food to completion.  Advised patient to take prednisone  with first dose of Augmentin  for the next 5 of 7 days.  Encouraged to increase daily water intake to 64 ounces per day while taking these medications.  Advised if symptoms worsen and/or unresolved please follow-up with PCP or here for further evaluation.  Patient discharged home, hemodynamically stable. Final Clinical Impressions(s) / UC Diagnoses   Final diagnoses:  Acute tonsillitis, unspecified etiology  Uvulitis     Discharge Instructions       Advised patient to take medication as directed with food  to completion.  Advised patient to take prednisone  with first dose of Augmentin  for the next 5 of 7 days.  Encouraged to increase daily water intake to 64 ounces per day while taking these medications.  Advised if symptoms worsen and/or unresolved please follow-up with PCP or here for further evaluation.     ED Prescriptions     Medication Sig Dispense Auth. Provider   amoxicillin -clavulanate (AUGMENTIN ) 875-125 MG tablet Take 1 tablet by mouth every 12 (twelve) hours. 14 tablet Audryanna Zurita, FNP   predniSONE  (DELTASONE ) 20 MG tablet Take 3 tabs PO daily x 5 days. 15 tablet Kaylah Chiasson, FNP      PDMP not reviewed this encounter.   Teddy Sharper, FNP 07/15/23 1041

## 2023-10-27 ENCOUNTER — Ambulatory Visit
Admission: RE | Admit: 2023-10-27 | Discharge: 2023-10-27 | Disposition: A | Discharge status: Home / Self Care | Source: Ambulatory Visit | Attending: Family Medicine | Admitting: Family Medicine

## 2023-10-27 ENCOUNTER — Ambulatory Visit (INDEPENDENT_AMBULATORY_CARE_PROVIDER_SITE_OTHER)

## 2023-10-27 VITALS — BP 134/88 | HR 82 | Temp 97.6°F | Resp 16

## 2023-10-27 DIAGNOSIS — S99921A Unspecified injury of right foot, initial encounter: Secondary | ICD-10-CM

## 2023-10-27 DIAGNOSIS — M79671 Pain in right foot: Secondary | ICD-10-CM | POA: Diagnosis not present

## 2023-10-27 DIAGNOSIS — M25571 Pain in right ankle and joints of right foot: Secondary | ICD-10-CM | POA: Diagnosis not present

## 2023-10-27 MED ORDER — CELECOXIB 200 MG PO CAPS: 200.0000 mg | ORAL_CAPSULE | Freq: Every day | ORAL | 0 refills | Status: AC

## 2023-10-27 NOTE — ED Provider Notes (Signed)
 [facility]    CSN: [id number] Arrival date & time: 10/27/23  0903      History   Chief Complaint Chief Complaint  Patient presents with   Ankle Pain    Right ankle     HPI Allen Barrett is a 23 y.o. male.   HPI pleasant 23 year old male presents with right ankle pain and right foot pain for 1 month.  Patient noticed pain worsening after a hike 1 month ago.  PMH significant for morbid obesity and ADHD.  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 Authorizing Provider  celecoxib (CELEBREX) 200 MG capsule Take 1 capsule (200 mg total) by mouth daily for 15 days. 10/27/23 11/11/23 Yes Leonides Ramp, FNP    Family History Family History  Problem Relation Age of Onset   Multiple sclerosis Mother    Hyperlipidemia Father    Hypertension Father    Asthma Neg Hx    Allergic rhinitis Neg Hx    Atopy Neg Hx    Eczema Neg Hx    Immunodeficiency Neg Hx    Angioedema Neg Hx     Social History Social History   Tobacco Use   Smoking status: Never    Passive exposure: Past (mother has in the past)   Smokeless tobacco: Never  Vaping Use   Vaping status: Never Used  Substance Use Topics   Alcohol use: No   Drug use: Not Currently    Types: Marijuana    Comment: occ     Allergies   Patient has no known allergies.   Review of Systems Review of Systems  Musculoskeletal:        Right ankle/right foot pain secondary to hiking 1 month ago  All other systems reviewed and are negative.    Physical Exam Triage Vital Signs ED Triage Vitals  Encounter Vitals Group     BP 10/27/23 0917 134/88      Systolic BP Percentile --      Diastolic BP Percentile --      Pulse Rate 10/27/23 0917 82     Resp 10/27/23 0917 16     Temp 10/27/23 0917 97.6 F (36.4 C)     Temp src --      SpO2 10/27/23 0917 98 %     Weight --      Height --      Head Circumference --      Peak Flow --      Pain Score 10/27/23 0913 2     Pain Loc --      Pain Education --      Exclude from Growth Chart --    No data found.  Updated Vital Signs BP 134/88   Pulse 82   Temp 97.6 F (36.4 C)   Resp 16   SpO2 98%    Physical Exam Vitals and nursing note reviewed.  Constitutional:      Appearance: Normal appearance. He is normal weight.  HENT:     Head: Normocephalic and atraumatic.     Mouth/Throat:     Mouth:  Mucous membranes are moist.     Pharynx: Oropharynx is clear.  Eyes:     Extraocular Movements: Extraocular movements intact.     Conjunctiva/sclera: Conjunctivae normal.     Pupils: Pupils are equal, round, and reactive to light.  Cardiovascular:     Pulses: Normal pulses.     Heart sounds: Normal heart sounds.  Pulmonary:     Effort: Pulmonary effort is normal.     Breath sounds: Normal breath sounds. No wheezing, rhonchi or rales.  Musculoskeletal:        General: Normal range of motion.     Comments: Right ankle/foot (inferior aspect of lateral lateral malleolus): TTP over ATFL, no soft tissue swelling, full range of motion demonstrated, including eversion/inversion  Skin:    General: Skin is warm and dry.  Neurological:     General: No focal deficit present.     Mental Status: He is alert and oriented to person, place, and time.  Psychiatric:        Mood and Affect: Mood normal.        Behavior: Behavior normal.      UC Treatments / Results  Labs (all labs ordered are listed, but only abnormal results are displayed) Labs Reviewed - No data to display  EKG   Radiology DG Foot Complete Right Result Date: 10/27/2023 CLINICAL DATA:  Trauma to the right foot. EXAM: RIGHT  FOOT COMPLETE - 3+ VIEW; RIGHT ANKLE - COMPLETE 3+ VIEW COMPARISON:  None Available. FINDINGS: No acute fracture or dislocation. There is mild degenerative changes of the interphalangeal joint of the great toe. The bones are well mineralized. The soft tissues are unremarkable IMPRESSION: No acute fracture or dislocation. Electronically Signed   By: Angus Bark M.D.   On: 10/27/2023 10:01   DG Ankle Complete Right Result Date: 10/27/2023 CLINICAL DATA:  Trauma to the right foot. EXAM: RIGHT FOOT COMPLETE - 3+ VIEW; RIGHT ANKLE - COMPLETE 3+ VIEW COMPARISON:  None Available. FINDINGS: No acute fracture or dislocation. There is mild degenerative changes of the interphalangeal joint of the great toe. The bones are well mineralized. The soft tissues are unremarkable IMPRESSION: No acute fracture or dislocation. Electronically Signed   By: Angus Bark M.D.   On: 10/27/2023 10:01    Procedures Procedures (including critical care time)  Medications Ordered in UC Medications - No data to display  Initial Impression / Assessment and Plan / UC Course  I have reviewed the triage vital signs and the nursing notes.  Pertinent labs & imaging results that were available during my care of the patient were reviewed by me and considered in my medical decision making (see chart for details).     MDM: 1.  Acute right ankle pain-right ankle x-ray results revealed above; 2.  Right foot pain-right foot x-ray results revealed above, Rx'd Celebrex 200 mg capsule: Take 1 capsule daily x 15 days. Advised patient may RICE affected area of right ankle/right foot for 30 minutes 3 times daily for the next 3 days.  Advised patient to take medication as directed with food to completion.  Encouraged to increase daily water intake to 64 ounces per day while taking this medication.  Advised if symptoms worsen and/or unresolved please follow-up with your PCP or [facility] orthopedics for further evaluation.  Final Clinical  Impressions(s) / UC Diagnoses   Final diagnoses:  Acute right ankle pain  Right foot pain     Discharge Instructions      Advised patient  may RICE affected area of right ankle/right foot for 30 minutes 3 times daily for the next 3 days.  Advised patient to take medication as directed with food to completion.  Encouraged to increase daily water intake to 64 ounces per day while taking this medication.  Advised if symptoms worsen and/or unresolved please follow-up with your PCP or [facility] orthopedics for further evaluation.   ED Prescriptions     Medication Sig Dispense Auth. Provider   celecoxib  (CELEBREX ) 200 MG capsule Take 1 capsule (200 mg total) by mouth daily for 15 days. 15 capsule Hamdi Kley, FNP      PDMP not reviewed this encounter.   Leonides Ramp, FNP 10/27/23 1015

## 2023-10-27 NOTE — ED Triage Notes (Addendum)
 Pt presents to uc with co right ankle pain/ foot pain on big toe side for one month. Pt noticed pain after a hike a month ago. Pain comes and goes worsens with more movement.

## 2023-10-27 NOTE — Discharge Instructions (Addendum)
 Advised patient may RICE affected area of right ankle/right foot for 30 minutes 3 times daily for the next 3 days.  Advised patient to take medication as directed with food to completion.  Encouraged to increase daily water intake to 64 ounces per day while taking this medication.  Advised if symptoms worsen and/or unresolved please follow-up with your PCP or [facility] orthopedics for further evaluation.
